# Patient Record
Sex: Female | Born: 2005 | Race: Black or African American | Hispanic: No | Marital: Single | Smoking: Never smoker
Health system: Southern US, Community
[De-identification: ages and names within clinical notes are randomized; demographics above are authoritative.]

---
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
## 2023-08-02 ENCOUNTER — Encounter: Payer: Medicaid Other | Admitting: Family Medicine

## 2023-08-16 ENCOUNTER — Encounter: Payer: Medicaid Other | Admitting: Family Medicine

## 2023-08-23 ENCOUNTER — Ambulatory Visit: Payer: Medicaid Other | Admitting: Family Medicine

## 2023-08-30 ENCOUNTER — Ambulatory Visit: Payer: Medicaid Other | Admitting: Student

## 2023-08-30 NOTE — Patient Instructions (Incomplete)
It was great to see you! Thank you for allowing me to participate in your care!  I recommend that you always bring your medications to each appointment as this makes it easy to ensure we are on the correct medications and helps [place] not miss when refills are needed.  Our plans for today:  - Nexplanon removal -   We are checking some labs today, I will call you if they are abnormal will send you a MyChart message or a letter if they are normal.  If you do not hear about your labs in the next 2 weeks please let us know.***  Take care and seek immediate care sooner if you develop any concerns.   Dr. Bess Kinds, MD [facility]

## 2023-08-30 NOTE — Progress Notes (Deleted)
  SUBJECTIVE:   CHIEF COMPLAINT / HPI:   Nexplanon  Removal -Placed 08/20/20  Contraception   PERTINENT  PMH / PSH: ***  No past medical history on file. OBJECTIVE:  There were no vitals taken for this visit. Physical Exam   ASSESSMENT/PLAN:   Assessment & Plan  No follow-ups on file. Penne Rhein, MD 08/30/2023, 7:15 AM PGY-***, [facility] {    This will disappear when note is signed, click to select method of visit    :1}

## 2023-09-06 ENCOUNTER — Ambulatory Visit (INDEPENDENT_AMBULATORY_CARE_PROVIDER_SITE_OTHER): Payer: Self-pay | Admitting: Student

## 2023-09-06 ENCOUNTER — Other Ambulatory Visit (HOSPITAL_COMMUNITY)
Admission: RE | Admit: 2023-09-06 | Discharge: 2023-09-06 | Disposition: A | Discharge status: Home / Self Care | Payer: Medicaid Other | Source: Ambulatory Visit | Attending: Family Medicine | Admitting: Family Medicine

## 2023-09-06 ENCOUNTER — Encounter: Payer: Self-pay | Admitting: Student

## 2023-09-06 VITALS — BP 126/80 | HR 96 | Ht 65.5 in | Wt 230.0 lb

## 2023-09-06 DIAGNOSIS — Z113 Encounter for screening for infections with a predominantly sexual mode of transmission: Secondary | ICD-10-CM | POA: Insufficient documentation

## 2023-09-06 DIAGNOSIS — L732 Hidradenitis suppurativa: Secondary | ICD-10-CM

## 2023-09-06 DIAGNOSIS — R5383 Other fatigue: Secondary | ICD-10-CM

## 2023-09-06 DIAGNOSIS — Z309 Encounter for contraceptive management, unspecified: Secondary | ICD-10-CM

## 2023-09-06 MED ORDER — METFORMIN HCL ER 500 MG PO TB24: 500.0000 mg | ORAL_TABLET | Freq: Every day | ORAL | 0 refills | Status: AC

## 2023-09-06 NOTE — Patient Instructions (Addendum)
It was great seeing you today.  As we discussed, - I prescribed Metformin for your hidradenitis suppurativa. - I will call you if testing is abnormal - At the front desk, schedule your Nexplanon removal   If you have any questions or concerns, please feel free to call the clinic.   Have a wonderful day,  Dr. Darral Dash [facility] [phone]

## 2023-09-06 NOTE — Progress Notes (Signed)
    SUBJECTIVE:   CHIEF COMPLAINT / HPI:   Karen Stephens is a 18 year-old female here for annual physical.  She works as a Child psychotherapist. She is going to go back to school this coming August. No daily medications.  Hidradenitis suppurativa Regarding her hydradenitis, she almost always a boil but severity fluctuates. Usually in her armpits, groins.  STI Screening Sexually active with men in the past. Has never been tested and wanted to get tested today. No vaginal discharge, irritation, odor. Nexplanon  is in left arm, placed 08/20/2020. She wants to take a break from hormonal  contraception, and she worries that it caused some weight gain. She did like having the Nexplanon .  PERTINENT  PMH / PSH:   OBJECTIVE:   BP 126/80   Pulse 96   Ht 5' 5.5 (1.664 m)   Wt 230 lb (104.3 kg)   SpO2 96%   BMI 37.69 kg/m   General: NAD, well appearing, obese Cardiac: RRR Neuro: A&O Respiratory: normal WOB on RA. No wheezing or crackles on auscultation, good lung sounds throughout Extremities: Moving all 4 extremities equally   ASSESSMENT/PLAN:   Hidradenitis No notable acute flares today necessitating antibiotic therapy. Discussed at bedtime clinic and potential role of prophylactic antibiotics if she is having persistent flares. Follow-up with PCP  Screening examination for STI Patient declined gynecologic exam or self/blind swab Urine cytology for gonorrhea/chlamydia swab collected today (although not as sensitive as vaginal swab listed above) and was negative. Discussed safe sex practices and use of condoms  Contraception management Patient wishes to proceed with Nexplanon  removal, will schedule appointment at front desk today. Discussed other options including oral combined OCPs, IUD, Depo-Provera. Discussed high likelihood of pregnancy if she does not utilize a form of contraception Please discuss further at her Nexplanon  removal appointment     Barabara Dama, DO [facility]  [facility]

## 2023-09-08 LAB — URINE CYTOLOGY ANCILLARY ONLY
Chlamydia: NEGATIVE
Comment: NEGATIVE
Comment: NEGATIVE
Comment: NORMAL
Neisseria Gonorrhea: NEGATIVE
Trichomonas: NEGATIVE

## 2023-09-10 ENCOUNTER — Encounter: Payer: Self-pay | Admitting: Student

## 2023-09-11 DIAGNOSIS — Z309 Encounter for contraceptive management, unspecified: Secondary | ICD-10-CM | POA: Insufficient documentation

## 2023-09-11 DIAGNOSIS — Z113 Encounter for screening for infections with a predominantly sexual mode of transmission: Secondary | ICD-10-CM | POA: Insufficient documentation

## 2023-09-11 NOTE — Assessment & Plan Note (Signed)
 Patient declined gynecologic exam or self/blind swab Urine cytology for gonorrhea/chlamydia swab collected today (although not as sensitive as vaginal swab listed above) and was negative. Discussed safe sex practices and use of condoms

## 2023-09-11 NOTE — Assessment & Plan Note (Addendum)
 Patient wishes to proceed with Nexplanon removal, will schedule appointment at front desk today. Discussed other options including oral combined OCPs, IUD, Depo-Provera. Discussed high likelihood of pregnancy if she does not utilize a form of contraception Please discuss further at her Nexplanon removal appointment

## 2023-09-11 NOTE — Assessment & Plan Note (Signed)
 No notable acute flares today necessitating antibiotic therapy. Discussed at bedtime clinic and potential role of prophylactic antibiotics if she is having persistent flares. Follow-up with PCP

## 2023-09-14 ENCOUNTER — Ambulatory Visit: Payer: Medicaid Other | Admitting: Student

## 2023-09-22 ENCOUNTER — Ambulatory Visit: Payer: Medicaid Other | Admitting: Student

## 2023-10-10 ENCOUNTER — Ambulatory Visit: Admitting: Family Medicine

## 2023-10-10 NOTE — Progress Notes (Deleted)
    SUBJECTIVE:   CHIEF COMPLAINT / HPI:   Nexplanon removal Alternative form of birth control?  PERTINENT  PMH / PSH: ***  OBJECTIVE:   There were no vitals taken for this visit. ***  General: NAD, pleasant, able to participate in exam Cardiac: RRR, no murmurs. Respiratory: CTAB, normal effort, No wheezes, rales or rhonchi Abdomen: Bowel sounds present, nontender, nondistended Extremities: no edema or cyanosis. Skin: warm and dry, no rashes noted Neuro: alert, no obvious focal deficits Psych: Normal affect and mood  ASSESSMENT/PLAN:   No problem-specific Assessment & Plan notes found for this encounter.     Dr. Elberta Fortis, DO [facility]    {    This will disappear when note is signed, click to select method of visit    :1}

## 2023-10-17 NOTE — Progress Notes (Unsigned)
    SUBJECTIVE:   CHIEF COMPLAINT / HPI:   Nexplanon removal Placed when she was 15, due for removal.  Would like time without hormonal contraception.  Not currently sexually active.  Will return to care to discuss possible replacement of Nexplanon when desired.  HS Chronic, uncontrolled with Hibiclens.  Not using topical antibiotic.  Reports she currently has 4 different areas of tracks on her inner thighs and buttock.  Also occurs in her axilla region.  Painful and bothersome.  Would like to see dermatology.  PERTINENT  PMH / PSH: Hidradenitis suppurativa  OBJECTIVE:   BP 112/60   Pulse 78   Ht 5\' 5"  (1.651 m)   Wt 224 lb 2 oz (101.7 kg)   SpO2 99%   BMI 37.30 kg/m    General: NAD, pleasant, able to participate in exam Skin: Induration, edema and some surrounding erythema tract on left inner thigh.  Scarring on right axilla consistent with prior at bedtime flare.  ASSESSMENT/PLAN:   Assessment & Plan Hidradenitis suppurativa Chronic, uncontrolled with Hibiclens.  Appears to currently be in a flare, will treat with oral antibiotic and transition to topical antibiotic for suppressive treatment. -Doxycycline 100 mg twice daily x 5 days -After oral antibiotics, transition to topical clindamycin over affected areas as needed -Continue Hibiclens and other supportive care including keeping the area dry -Referral to dermatology at patient request   Nexplanon Removal Patient identified, informed consent performed, consent signed.   Appropriate time out taken. Nexplanon site identified.  Area prepped in usual sterile fashon. Three ml of 1% lidocaine was used to anesthetize the area at the distal end of the implant. A small stab incision was made right beside the implant on the distal portion.  The Nexplanon rod was grasped using hemostats and removed without difficulty.  There was minimal blood loss. There were no complications. Steri-strips were applied over the small incision.  A  pressure bandage was applied to reduce any bruising.  The patient tolerated the procedure well and was given post procedure instructions.  Patient is planning to use abstinence for contraception/attempt conception.   Dr. Elberta Fortis, DO [facility]

## 2023-10-18 ENCOUNTER — Encounter: Payer: Self-pay | Admitting: Family Medicine

## 2023-10-18 ENCOUNTER — Ambulatory Visit (INDEPENDENT_AMBULATORY_CARE_PROVIDER_SITE_OTHER): Payer: Self-pay | Admitting: Family Medicine

## 2023-10-18 VITALS — BP 112/60 | HR 78 | Ht 65.0 in | Wt 224.1 lb

## 2023-10-18 DIAGNOSIS — Z975 Presence of (intrauterine) contraceptive device: Secondary | ICD-10-CM

## 2023-10-18 DIAGNOSIS — L732 Hidradenitis suppurativa: Secondary | ICD-10-CM

## 2023-10-18 MED ORDER — DOXYCYCLINE HYCLATE 100 MG PO TABS: 100.0000 mg | ORAL_TABLET | Freq: Two times a day (BID) | ORAL | 0 refills | Status: AC

## 2023-10-18 MED ORDER — CLINDAMYCIN PHOSPHATE 1 % EX GEL: Freq: Every day | CUTANEOUS | 0 refills | Status: AC | PRN

## 2023-10-18 NOTE — Patient Instructions (Signed)
 It was wonderful to see you today! Thank you for choosing [facility].   Please bring ALL of your medications with you to every visit.   Today we talked about:  We removed your Nexplanon today, please keep in mind your most immediately return to fertility as we discussed.  If you would like a Nexplanon reinserted or other forms of birth control please let [place] know.  As we discussed you may also have irregular bleeding for the next month but it should normalize after that time. He will likely have bruising and soreness in the area that usually lasts around 1 to 2 weeks.  If you have any concerns about signs of infection as below please let us know.  Please follow up as needed  If you haven't already, sign up for My Chart to have easy access to your labs results, and communication with your primary care physician.   We are checking some labs today. If they are abnormal, I will call you. If they are normal, I will send you a MyChart message (if it is active) or a letter in the mail. If you do not hear about your labs in the next 2 weeks, please call the office.  Call the clinic at [phone] if your symptoms worsen or you have any concerns.  Please be sure to schedule follow up at the front desk before you leave today.   Elberta Fortis, DO Family Medicine  Nexplanon Removal, Care After Refer to this sheet in the next few weeks. These instructions provide you with information about caring for yourself after your procedure. Your health care provider may also give you more specific instructions. Your treatment has been planned according to current medical practices, but problems sometimes occur. Call your health care provider if you have any problems or questions after your procedure. What can I expect after the procedure? After the procedure, it is common to have: Soreness. Warmth. Swelling. Follow these instructions at home: Bathing If you were given a bandage  (dressing), keep it dry until your health care provider says it can be removed. Ask your health care provider when you can start showering or taking a bath. Managing pain, stiffness, and swelling If directed, apply ice to the area: Put ice in a plastic bag. Place a towel between your skin and the bag. Leave the ice on for 20 minutes, 2-3 times per day. Do not apply heat to your knee. Raise the injection area above the level of your heart while you are sitting or lying down. Activity Avoid strenuous activities for as long as directed by your health care provider. Ask your health care provider when you can return to your normal activities. General instructions Take medicines only as directed by your health care provider. Do not take aspirin or other over-the-counter medicines unless your health care provider says you can. Check your site every day for signs of infection. Watch for: Redness, swelling, or pain. Fluid, blood, or pus. Follow your health care provider's instructions about dressing changes and removal. Contact a health care provider if: You have symptoms at your injection site that last longer than two days after your procedure. You have redness, swelling, or pain in your injection area. You have fluid, blood, or pus coming from your injection site. You have warmth in your injection area. You have a fever. Your pain is not controlled with medicine. Get help right away if:

## 2024-06-14 ENCOUNTER — Ambulatory Visit: Admitting: Dermatology
